# Patient Record
Sex: Female | Born: 1975 | Race: Black or African American | Hispanic: No | Marital: Single | Smoking: Current some day smoker
Health system: Southern US, Community
[De-identification: ages and names within clinical notes are randomized; demographics above are authoritative.]

## PROBLEM LIST (undated history)

## (undated) DIAGNOSIS — IMO0001 Reserved for inherently not codable concepts without codable children: Secondary | ICD-10-CM

## (undated) HISTORY — PX: NO PAST SURGERIES: SHX2092

---

## 2015-01-10 ENCOUNTER — Encounter: Payer: Self-pay | Admitting: Medical Oncology

## 2015-01-10 ENCOUNTER — Emergency Department
Admission: EM | Admit: 2015-01-10 | Discharge: 2015-01-10 | Disposition: A | Discharge status: Home / Self Care | Payer: Medicaid Other | Attending: Emergency Medicine | Admitting: Emergency Medicine

## 2015-01-10 ENCOUNTER — Emergency Department: Payer: Medicaid Other

## 2015-01-10 DIAGNOSIS — O99331 Smoking (tobacco) complicating pregnancy, first trimester: Secondary | ICD-10-CM | POA: Insufficient documentation

## 2015-01-10 DIAGNOSIS — O3680X0 Pregnancy with inconclusive fetal viability, not applicable or unspecified: Secondary | ICD-10-CM

## 2015-01-10 DIAGNOSIS — O209 Hemorrhage in early pregnancy, unspecified: Secondary | ICD-10-CM | POA: Diagnosis present

## 2015-01-10 DIAGNOSIS — F17210 Nicotine dependence, cigarettes, uncomplicated: Secondary | ICD-10-CM | POA: Diagnosis not present

## 2015-01-10 DIAGNOSIS — Z3A01 Less than 8 weeks gestation of pregnancy: Secondary | ICD-10-CM | POA: Diagnosis not present

## 2015-01-10 DIAGNOSIS — O200 Threatened abortion: Secondary | ICD-10-CM

## 2015-01-10 HISTORY — DX: Reserved for inherently not codable concepts without codable children: IMO0001

## 2015-01-10 LAB — CBC WITH DIFFERENTIAL/PLATELET
BASOS ABS: 0.1 10*3/uL (ref 0–0.1)
BASOS PCT: 1 %
EOS ABS: 0.3 10*3/uL (ref 0–0.7)
Eosinophils Relative: 4 %
HEMATOCRIT: 41.7 % (ref 35.0–47.0)
Hemoglobin: 13.2 g/dL (ref 12.0–16.0)
Lymphocytes Relative: 39 %
Lymphs Abs: 3.4 10*3/uL (ref 1.0–3.6)
MCH: 22.6 pg — ABNORMAL LOW (ref 26.0–34.0)
MCHC: 31.7 g/dL — AB (ref 32.0–36.0)
MCV: 71.4 fL — ABNORMAL LOW (ref 80.0–100.0)
MONO ABS: 0.7 10*3/uL (ref 0.2–0.9)
Monocytes Relative: 8 %
NEUTROS ABS: 4.1 10*3/uL (ref 1.4–6.5)
Neutrophils Relative %: 48 %
PLATELETS: 306 10*3/uL (ref 150–440)
RBC: 5.84 MIL/uL — ABNORMAL HIGH (ref 3.80–5.20)
RDW: 14.3 % (ref 11.5–14.5)
WBC: 8.7 10*3/uL (ref 3.6–11.0)

## 2015-01-10 LAB — COMPREHENSIVE METABOLIC PANEL
ALBUMIN: 4 g/dL (ref 3.5–5.0)
ALT: 12 U/L — ABNORMAL LOW (ref 14–54)
ANION GAP: 7 (ref 5–15)
AST: 17 U/L (ref 15–41)
Alkaline Phosphatase: 58 U/L (ref 38–126)
BUN: 13 mg/dL (ref 6–20)
CHLORIDE: 107 mmol/L (ref 101–111)
CO2: 27 mmol/L (ref 22–32)
Calcium: 9 mg/dL (ref 8.9–10.3)
Creatinine, Ser: 0.85 mg/dL (ref 0.44–1.00)
GFR calc Af Amer: >60 mL/min (ref >60)
GFR calc non Af Amer: >60 mL/min (ref >60)
GLUCOSE: 89 mg/dL (ref 65–99)
POTASSIUM: 3.6 mmol/L (ref 3.5–5.1)
SODIUM: 141 mmol/L (ref 135–145)
Total Bilirubin: 0.7 mg/dL (ref 0.3–1.2)
Total Protein: 7.3 g/dL (ref 6.5–8.1)

## 2015-01-10 LAB — HCG, QUANTITATIVE, PREGNANCY: HCG, BETA CHAIN, QUANT, S: 826 m[IU]/mL — AB (ref <5)

## 2015-01-10 LAB — ABO/RH: ABO/RH(D): O POS

## 2015-01-10 NOTE — ED Provider Notes (Signed)
[facility] Emergency Department Provider Note  ____________________________________________  Time seen: Approximately 5:42 PM  I have reviewed the triage vital signs and the nursing notes.   HISTORY  Chief Complaint Vaginal Bleeding    HPI Tracie Gamble is a 39 y.o. female G6 P4 Ab1 at possibly 4-[redacted] weeks gestation based on LMP who presents with intermittent light vaginal bleeding for 2-3 days.  She reports that she just found out she was pregnant by urine pregnancy test.  She started having the intermittent bleeding notable mostly when she wipes about 3 days ago.She has been wearing a pad but is only had to change it once a day.  She went to her primary care doctor today who repeated the positive pregnancy test and this suggested she come to the emergency department for evaluation of bleeding during early pregnancy.  She reports having moderate lower abdominal cramping when the bleeding first began several days ago but no longer has any cramping.  She has no nausea, vomiting, chest pain, shortness of breath, fever/chills, dysuria.   Past Medical History  Diagnosis Date  . Healthy adult     There are no active problems to display for this patient.   Past Surgical History  Procedure Laterality Date  . No past surgeries      Current Outpatient Rx  Name  Route  Sig  Dispense  Refill  . ibuprofen (ADVIL,MOTRIN) 600 MG tablet   Oral   Take 600 mg by mouth every 6 (six) hours as needed for mild pain.           Allergies Review of patient's allergies indicates no known allergies.  No family history on file.  Social History Social History  Substance Use Topics  . Smoking status: Current Some Day Smoker -- 0.50 packs/day for 15 years    Types: Cigarettes  . Smokeless tobacco: None  . Alcohol Use: No    Review of Systems Constitutional: No fever/chills Eyes: No visual changes. ENT: No sore throat. Cardiovascular: Denies chest  pain. Respiratory: Denies shortness of breath. Gastrointestinal: Lower abdominal cramping several days ago.  No nausea, no vomiting.  No diarrhea.  No constipation. Genitourinary: Negative for dysuria.  Light vaginal bleeding Musculoskeletal: Negative for back pain. Skin: Negative for rash. Neurological: Negative for headaches, focal weakness or numbness.  10-point ROS otherwise negative.  ____________________________________________   PHYSICAL EXAM:  VITAL SIGNS: ED Triage Vitals  Enc Vitals Group     BP 01/10/15 1657 161/106 mmHg     Pulse Rate 01/10/15 1657 74     Resp 01/10/15 1657 18     Temp 01/10/15 1657 98.4 F (36.9 C)     Temp Source 01/10/15 1657 Oral     SpO2 01/10/15 1657 100 %     Weight 01/10/15 1657 194 lb (87.998 kg)     Height 01/10/15 1657  (1.676 m)     Head Cir --      Peak Flow --      Pain Score 01/10/15 1657 6     Pain Loc --      Pain Edu? --      Excl. in GC? --     Constitutional: Alert and oriented. Well appearing and in no acute distress. Eyes: Conjunctivae are normal. PERRL. EOMI. Head: Atraumatic. Nose: No congestion/rhinnorhea. Mouth/Throat: Mucous membranes are moist.  Oropharynx non-erythematous. Neck: No stridor.   Cardiovascular: Normal rate, regular rhythm. Grossly normal heart sounds.  Good peripheral circulation. Respiratory: Normal respiratory effort.  No retractions. Lungs CTAB. Gastrointestinal: Soft and nontender. No distention. No abdominal bruits. No CVA tenderness. Genitourinary: Deferred Musculoskeletal: No lower extremity tenderness nor edema.  No joint effusions. Neurologic:  Normal speech and language. No gross focal neurologic deficits are appreciated.  Skin:  Skin is warm, dry and intact. No rash noted. Psychiatric: Mood and affect are normal. Speech and behavior are normal.  ____________________________________________   LABS (all labs ordered are listed, but only abnormal results are displayed)  Labs  Reviewed  HCG, QUANTITATIVE, PREGNANCY - Abnormal; Notable for the following:    hCG, Beta Chain, Quant, S 826 (*)    All other components within normal limits  CBC WITH DIFFERENTIAL/PLATELET - Abnormal; Notable for the following:    RBC 5.84 (*)    MCV 71.4 (*)    MCH 22.6 (*)    MCHC 31.7 (*)    All other components within normal limits  COMPREHENSIVE METABOLIC PANEL - Abnormal; Notable for the following:    ALT 12 (*)    All other components within normal limits  ABO/RH   ____________________________________________  EKG  Not indicated ____________________________________________  RADIOLOGY  Aura Camps, Harlie Buening, personally discussed these images and results by phone with the on-call radiologist and used this discussion as part of my medical decision making.    US Ob Comp Less 14 Wks  01/10/2015   CLINICAL DATA:  Gravida 6 para 4 with LMP 12/07/2014. EDC by LMP is 09/13/2015. In patient is 4 weeks 6 days by LMP. Quantitative beta HCG is 826. Vaginal bleeding.  EXAM: OBSTETRIC <14 WK [place] AND TRANSVAGINAL OB US  TECHNIQUE: Both transabdominal and transvaginal ultrasound examinations were performed for complete evaluation of the gestation as well as the maternal uterus, adnexal regions, and pelvic cul-de-sac. Transvaginal technique was performed to assess early pregnancy.  COMPARISON:  None.  FINDINGS: Intrauterine gestational sac: Not seen  Yolk sac:  Not seen  Embryo:  Not seen  Cardiac Activity: Not seen  Maternal uterus/adnexae: Adjacent to the right ovary there is a small discrete solid structure measuring 3.5 x 1.8 x 3.4 cm. This appears separate from the ovary and contains internal blood flow on Doppler evaluation. Findings are suspicious for ectopic pregnancy. The right ovary otherwise has a normal appearance with small corpus luteum cyst. The left ovary has a normal appearance. There is a small amount of free pelvic fluid.  Multiple fibroids are present. Posterior lower uterine segment  fibroid is 2.8 x 2.5 x 2.6 cm. Right uterine fundal fibroid is 3.4 x 3.4 x 3.3 cm.  IMPRESSION: 1. Right adnexal mass suspicious for possible ectopic pregnancy. 2. No intrauterine pregnancy identified. 3. Right corpus luteum cyst. 4. Small amount of free pelvic fluid. 5. Uterine fibroids present.  Critical Value/emergent results were called by telephone at the time of interpretation on 01/10/2015 at 7:42 pm to Dr. Loleta Rose , who verbally acknowledged these results.   Electronically Signed   By: Norva Pavlov M.D.   On: 01/10/2015 19:47   US Ob Transvaginal  01/10/2015   CLINICAL DATA:  Gravida 6 para 4 with LMP 12/07/2014. EDC by LMP is 09/13/2015. In patient is 4 weeks 6 days by LMP. Quantitative beta HCG is 826. Vaginal bleeding.  EXAM: OBSTETRIC <14 WK [place] AND TRANSVAGINAL OB US  TECHNIQUE: Both transabdominal and transvaginal ultrasound examinations were performed for complete evaluation of the gestation as well as the maternal uterus, adnexal regions, and pelvic cul-de-sac. Transvaginal technique was performed to assess early pregnancy.  COMPARISON:  None.  FINDINGS: Intrauterine gestational sac: Not seen  Yolk sac:  Not seen  Embryo:  Not seen  Cardiac Activity: Not seen  Maternal uterus/adnexae: Adjacent to the right ovary there is a small discrete solid structure measuring 3.5 x 1.8 x 3.4 cm. This appears separate from the ovary and contains internal blood flow on Doppler evaluation. Findings are suspicious for ectopic pregnancy. The right ovary otherwise has a normal appearance with small corpus luteum cyst. The left ovary has a normal appearance. There is a small amount of free pelvic fluid.  Multiple fibroids are present. Posterior lower uterine segment fibroid is 2.8 x 2.5 x 2.6 cm. Right uterine fundal fibroid is 3.4 x 3.4 x 3.3 cm.  IMPRESSION: 1. Right adnexal mass suspicious for possible ectopic pregnancy. 2. No intrauterine pregnancy identified. 3. Right corpus luteum cyst. 4. Small amount of  free pelvic fluid. 5. Uterine fibroids present.  Critical Value/emergent results were called by telephone at the time of interpretation on 01/10/2015 at 7:42 pm to Dr. Loleta Rose , who verbally acknowledged these results.   Electronically Signed   By: Norva Pavlov M.D.   On: 01/10/2015 19:47    ____________________________________________   PROCEDURES  Procedure(s) performed: None  Critical Care performed: No ____________________________________________   INITIAL IMPRESSION / ASSESSMENT AND PLAN / ED COURSE  Pertinent labs & imaging results that were available during my care of the patient were reviewed by me and considered in my medical decision making (see chart for details).  Based on the abnormal ultrasound, I consult to Dr. Jean Rosenthal with OB/GYN who saw the patient personally in the emergency department.  He feel she is safe to go home and return in 2 days for repeat hCG.  He is giving her an order for an outpatient hCG lab test.  He and I both gave her the usual and customary return precautions.  The patient's blood type is O+ so she does not need RhoGAM. ____________________________________________  FINAL CLINICAL IMPRESSION(S) / ED DIAGNOSES  Final diagnoses:  Threatened abortion in early pregnancy  Pregnancy of unknown anatomic location      NEW MEDICATIONS STARTED DURING THIS VISIT:  Discharge Medication List as of 01/10/2015  9:52 PM       Loleta Rose, MD 01/10/15 2225

## 2015-01-10 NOTE — Discharge Instructions (Signed)
As we discussed, we are not certain at this point if you have a pregnancy and an abnormal location, such as on your ovary (ectopic pregnancy).  Sometimes this condition can be managed with medication and other times you require surgery.  As Dr. Jean Rosenthal recommended, please return on Sunday for outpatient lab testing to repeat your hCG level.  Please call the number provided by Dr. Jean Rosenthal [phone]) about two hours after the testing to talk with Dr. Tiburcio Pea.  If you develop any new or worsening symptoms in the meantime, please return immediately to the emergency department..   Threatened Miscarriage A threatened miscarriage occurs when you have vaginal bleeding during your first 20 weeks of pregnancy but the pregnancy has not ended. If you have vaginal bleeding during this time, your health care provider will do tests to make sure you are still pregnant. If the tests show you are still pregnant and the developing baby (fetus) inside your womb (uterus) is still growing, your condition is considered a threatened miscarriage. A threatened miscarriage does not mean your pregnancy will end, but it does increase the risk of losing your pregnancy (complete miscarriage). CAUSES  The cause of a threatened miscarriage is usually not known. If you go on to have a complete miscarriage, the most common cause is an abnormal number of chromosomes in the developing baby. Chromosomes are the structures inside cells that hold all your genetic material. Some causes of vaginal bleeding that do not result in miscarriage include:  Having sex.  Having an infection.  Normal hormone changes of pregnancy.  Bleeding that occurs when an egg implants in your uterus. RISK FACTORS Risk factors for bleeding in early pregnancy include:  Obesity.  Smoking.  Drinking excessive amounts of alcohol or caffeine.  Recreational drug use. SIGNS AND SYMPTOMS  Light vaginal bleeding.  Mild abdominal pain or  cramps. DIAGNOSIS  If you have bleeding with or without abdominal pain before 20 weeks of pregnancy, your health care provider will do tests to check whether you are still pregnant. One important test involves using sound waves and a computer (ultrasound) to create images of the inside of your uterus. Other tests include an internal exam of your vagina and uterus (pelvic exam) and measurement of your baby's heart rate.  You may be diagnosed with a threatened miscarriage if:  Ultrasound testing shows you are still pregnant.  Your baby's heart rate is strong.  A pelvic exam shows that the opening between your uterus and your vagina (cervix) is closed.  Your heart rate and blood pressure are stable.  Blood tests confirm you are still pregnant. TREATMENT  No treatments have been shown to prevent a threatened miscarriage from going on to a complete miscarriage. However, the right home care is important.  HOME CARE INSTRUCTIONS   Make sure you keep all your appointments for prenatal care. This is very important.  Get plenty of rest.  Do not have sex or use tampons if you have vaginal bleeding.  Do not douche.  Do not smoke or use recreational drugs.  Do not drink alcohol.  Avoid caffeine. SEEK MEDICAL CARE IF:  You have light vaginal bleeding or spotting while pregnant.  You have abdominal pain or cramping.  You have a fever. SEEK IMMEDIATE MEDICAL CARE IF:  You have heavy vaginal bleeding.  You have blood clots coming from your vagina.  You have severe low back pain or abdominal cramps.  You have fever, chills, and severe abdominal pain. MAKE SURE YOU:  Understand these instructions.  Will watch your condition.  Will get help right away if you are not doing well or get worse. Document Released: 04/26/2005 Document Revised: 05/01/2013 Document Reviewed: 02/20/2013 [facility] Patient Information 2015 [place], [place]. This information is not intended to replace advice  given to you by your health care provider. Make sure you discuss any questions you have with your health care provider.

## 2015-01-10 NOTE — ED Notes (Signed)
Patient transported to Ultrasound 

## 2015-01-10 NOTE — ED Notes (Signed)
Pt reports she had recent positive preg test, went to pcp today for vaginal bleeding and PCP told pt to f/u at the ER for ultrasound. Pt denies pain at this time.

## 2015-01-10 NOTE — ED Notes (Signed)
Lab call regarding ABO/RH add on, states has in lab currently and will add on at this time

## 2015-01-10 NOTE — ED Notes (Signed)
Pt reports that she is approximately [redacted] weeks pregnant based on the date of LMP. Pt reports that she began to bleed on Wednesday along with some cramping. She reports that she is no longer cramping, and that the bleeding continues. She states that the bleeding is lighter than a normal period, and that she notices it when she urinates and there is some blood on the toilet paper when she wipes. Pt alert & oriented with warm, dry skin. NAD noted.

## 2015-01-10 NOTE — Consult Note (Signed)
GYNECOLOGY CONSULT NOTE  GYN Consultation  Attending Provider: Loleta Rose, MD  Tracie Gamble [id number] 01/10/2015 9:40 PM    Reason for Consultation:   Tracie Gamble is a 39 y.o. [id number] female seen at the request of Dr. Loleta Rose for evaluation of vaginal bleeding in the setting of pregnancy associated with abdominal pain with an overall concern for ectopic pregnancy.    History of Present Ilness:   She presents with new-onset light vaginal bleeding that started two days ago and has gradually become lighter, to the point that it has gone away.  She also had some lower abdominal discomfort that has not also resolved.  She saw her primary care provider today who recommended that she come to the hospital for an ultrasound.  She denies a history of PID, prior tubal or pelvic surgery, and prior ectopic pregnancy.  She denies fevers, chills.  She is taking nothing for contraception at this time.  Past Medical History  Diagnosis Date  . Healthy adult    Past Surgical History  Procedure Laterality Date  . No past surgeries     Allergies:No Known Allergies   Prior to Admission medications   Medication Sig Start Date End Date Taking? Authorizing Provider  ibuprofen (ADVIL,MOTRIN) 600 MG tablet Take 600 mg by mouth every 6 (six) hours as needed for mild pain.   Yes Historical Provider, MD   Obstetric History: She is a [id number] female s/p SVD x 4.    Gynecologic History:   Patient's last menstrual period was 12/07/2014 (exact date). Cycle Hx: flow is moderate Last Pap: normal about two years ago. She notes a distant history of an STD in 1997 which was treated. Contraception: none  Social History:  She  reports that she has been smoking Cigarettes.  She has a 7.5 pack-year smoking history. She does not have any smokeless tobacco history on file. She reports that she does not drink alcohol or use illicit drugs.  Family History:  family history is not on file.   Review of Systems:   Pertinent positives include headache.  Otherwise negative x 10 systems reviewed except as noted in the HPI.    Objective    BP 149/87 mmHg  Pulse 58  Temp(Src) 98.4 F (36.9 C) (Oral)  Resp 16  Ht  (1.676 m)  Wt 194 lb (87.998 kg)  BMI 31.33 kg/m2  SpO2 99%  LMP 12/07/2014 (Exact Date) Physical Exam  General:  She is a well appearing female in no distress.  HEENT:  Normocephalic, atraumatic.   Neck:  supple, no lymphadenopathy Cardiac:  RRR Pulmonary:  Clear to auscultation bilaterally. No wheezes, rales, rhonchi.   Abdomen:  Soft, nontender, non-distended, +BS, no rebound or guarding  Pelvic:  deferred Extremities:  Non-tender, symmetric no edema bilaterally.   Neurologic:  Alert & oriented x 3.  Appropriate, conversant.    Laboratory Results:   Lab Results  Component Value Date   WBC 8.7 01/10/2015   RBC 5.84* 01/10/2015   HGB 13.2 01/10/2015   HCT 41.7 01/10/2015   PLT 306 01/10/2015   NA 141 01/10/2015   K 3.6 01/10/2015   CREATININE 0.85 01/10/2015   Quant hCT: 826 Blood type: O+  Imaging Results: Pelvic U/S Report performed 01/10/15 FINDINGS: Intrauterine gestational sac: Not seen  Yolk sac: Not seen  Embryo: Not seen  Cardiac Activity: Not seen  Maternal uterus/adnexae: Adjacent to the right ovary there is a small discrete solid structure measuring 3.5 x 1.8 x 3.4  cm. This appears separate from the ovary and contains internal blood flow on Doppler evaluation. Findings are suspicious for ectopic pregnancy. The right ovary otherwise has a normal appearance with small corpus luteum cyst. The left ovary has a normal appearance. There is a small amount of free pelvic fluid.  Multiple fibroids are present. Posterior lower uterine segment fibroid is 2.8 x 2.5 x 2.6 cm. Right uterine fundal fibroid is 3.4 x 3.4 x 3.3 cm.  IMPRESSION: 1. Right adnexal mass suspicious for possible ectopic pregnancy. 2. No intrauterine pregnancy identified. 3.  Right corpus luteum cyst. 4. Small amount of free pelvic fluid. 5. Uterine fibroids present.   Assessment & Recommendations  Tracie Gamble is a 39 y.o. [id number] premenopausal female with vaginal bleeding and abdominal pain (though largely resolved now) being seen in consultation.  Differential diagnosis includes ectopic pregnancy, normal pregnancy with threatened abortion and incidental finding of an adnexal mass. Discussed that I am highly concerned that she has an ectopic pregnancy.  We discussed that I also could not rule out a normal pregnancy given her LMP and that she has had only light bleeding and mild cramping. Discussed risks of ectopic pregnancy and potential dangers of not following up on this condition, including; excessive bleeding and ultimately risk of death.  The recommendations below reflect the mutually agreed upon plan by both of [place].  Recommendations:  1.  Present to [facility] in two days for re-draw of quant hCG at outpatient draw station. She was given an order for this draw on a prescription pad piece of paper with very specific instructions on how to come to the hospital and register and then go to the lab. 2.  Call the on-call pager two hours after having her lab work drawn.  She was given the number to [facility] in written format. She was instructed to follow the prompts to the on-call pager. Dr Tiburcio Pea will review her results and form a follow up plan of action for her at this point.   3. She is to return to the ER should she have any return of abdominal or pelvic pain and/or vaginal bleeding.  She voiced understanding and agreement with the plan.  Conard Novak, MD 01/10/2015 9:40 PM

## 2015-01-10 NOTE — ED Notes (Signed)
OB MD at bedside

## 2015-01-12 ENCOUNTER — Other Ambulatory Visit
Admission: AD | Admit: 2015-01-12 | Discharge: 2015-01-12 | Disposition: A | Discharge status: Home / Self Care | Payer: Medicaid Other | Source: Other Acute Inpatient Hospital | Attending: Obstetrics and Gynecology | Admitting: Obstetrics and Gynecology

## 2015-01-12 DIAGNOSIS — Z3490 Encounter for supervision of normal pregnancy, unspecified, unspecified trimester: Secondary | ICD-10-CM | POA: Diagnosis not present

## 2015-01-12 LAB — HCG, QUANTITATIVE, PREGNANCY: HCG, BETA CHAIN, QUANT, S: 1237 m[IU]/mL — AB (ref <5)

## 2015-11-13 ENCOUNTER — Other Ambulatory Visit: Payer: Self-pay | Admitting: Obstetrics and Gynecology

## 2015-11-13 DIAGNOSIS — O200 Threatened abortion: Secondary | ICD-10-CM

## 2015-11-14 ENCOUNTER — Ambulatory Visit
Admission: RE | Admit: 2015-11-14 | Discharge: 2015-11-14 | Disposition: A | Discharge status: Home / Self Care | Payer: Medicaid Other | Source: Ambulatory Visit | Attending: Obstetrics and Gynecology | Admitting: Obstetrics and Gynecology

## 2015-11-14 DIAGNOSIS — O3411 Maternal care for benign tumor of corpus uteri, first trimester: Secondary | ICD-10-CM | POA: Diagnosis not present

## 2015-11-14 DIAGNOSIS — O200 Threatened abortion: Secondary | ICD-10-CM | POA: Insufficient documentation

## 2015-11-14 DIAGNOSIS — Z3A01 Less than 8 weeks gestation of pregnancy: Secondary | ICD-10-CM | POA: Insufficient documentation

## 2015-11-14 DIAGNOSIS — O99411 Diseases of the circulatory system complicating pregnancy, first trimester: Secondary | ICD-10-CM | POA: Insufficient documentation

## 2016-07-21 IMAGING — US US OB COMP LESS 14 WK
1 series · 13 of 28 positions shown · non-contrast
Comparison: None.

CLINICAL DATA: Gravida 6 para 4 with LMP 12/07/2014. EDC by LMP is
09/13/2015. In patient is 4 weeks 6 days by LMP. Quantitative beta
HCG is 826. Vaginal bleeding.

EXAM:
OBSTETRIC <14 WK US AND TRANSVAGINAL OB US
TECHNIQUE: Both transabdominal and transvaginal ultrasound examinations were
performed for complete evaluation of the gestation as well as the
maternal uterus, adnexal regions, and pelvic cul-de-sac.
Transvaginal technique was performed to assess early pregnancy.

[Series 1: us ob comp less 14 wk · 0.18mm/px · 13 of 112 slices shown]
[im 5/112]
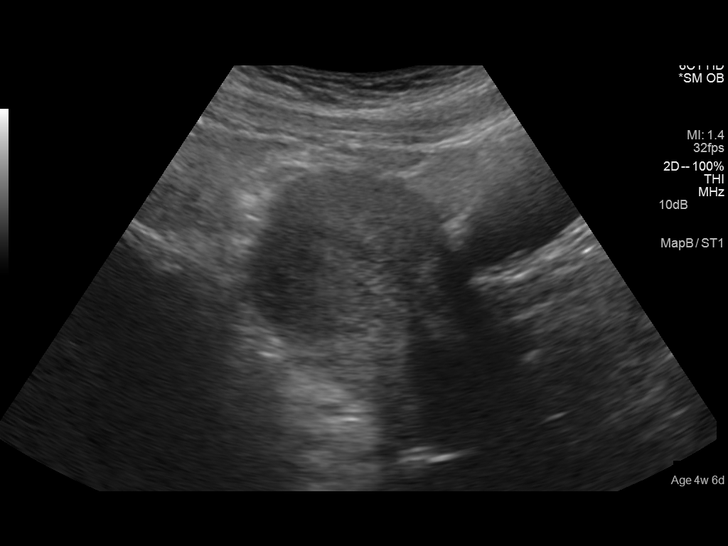
[im 13/112]
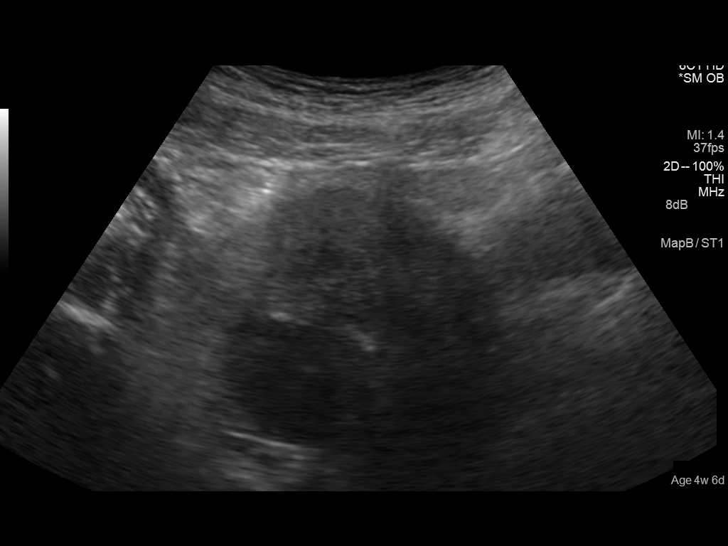
[im 21/112]
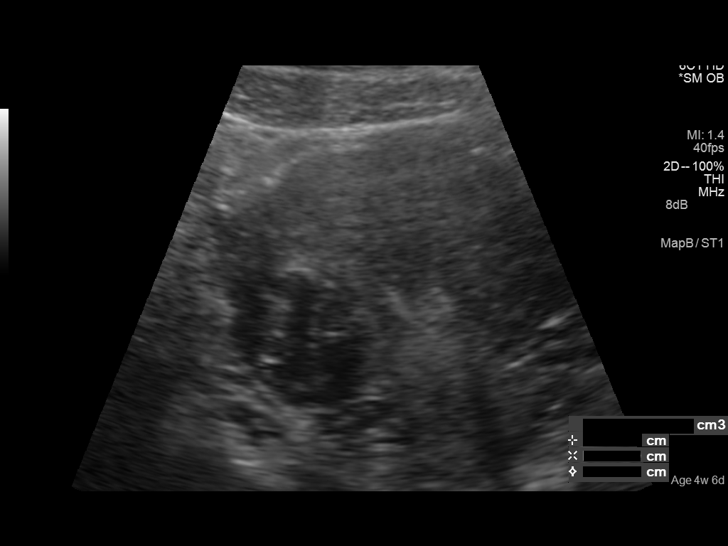
[im 29/112]
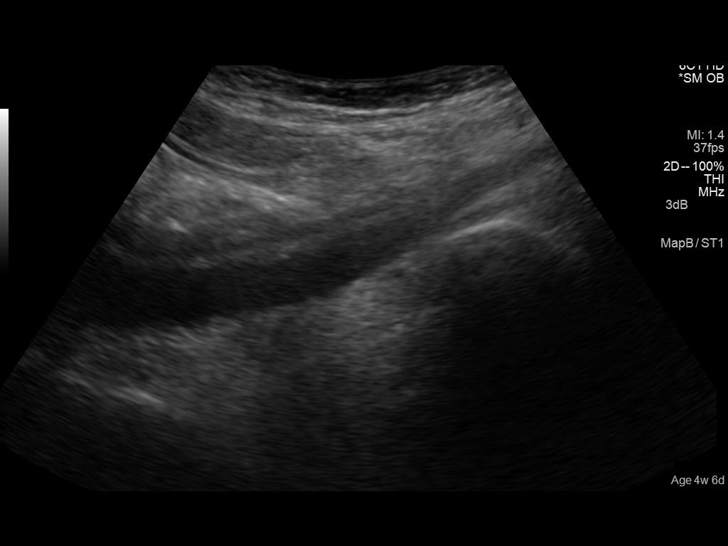
[im 38/112]
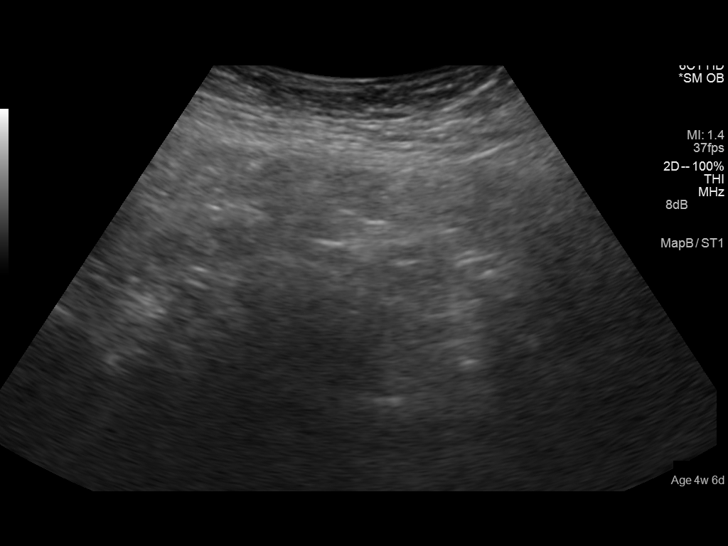
[im 46/112]
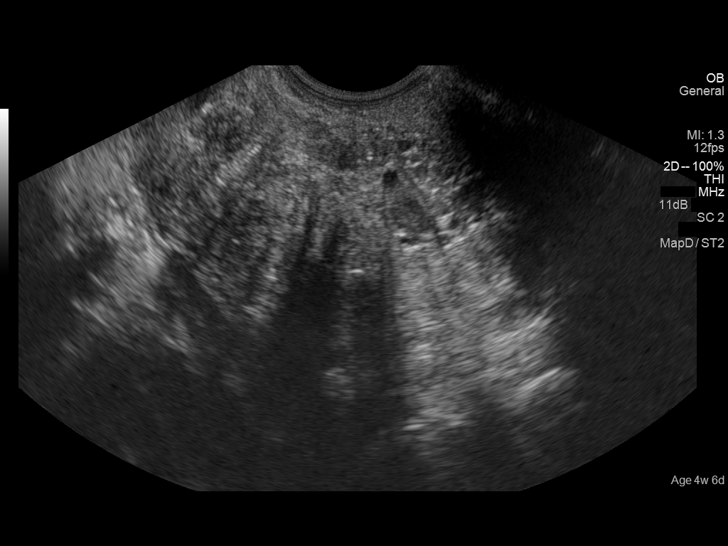
[im 58/112]
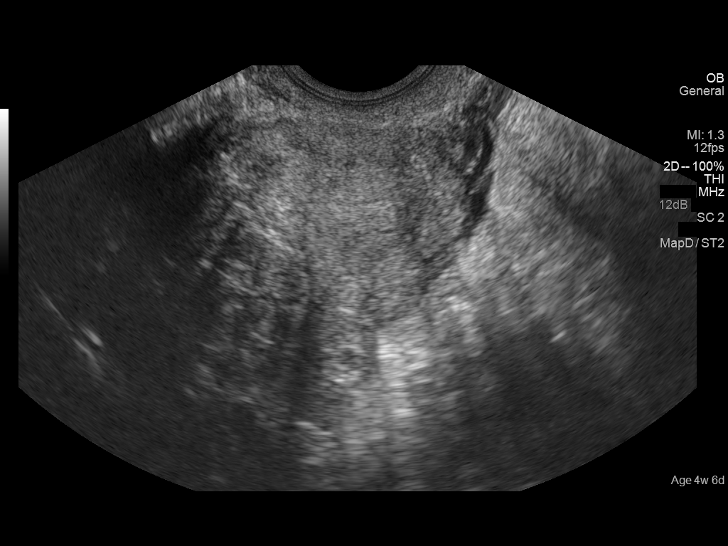
[im 66/112]
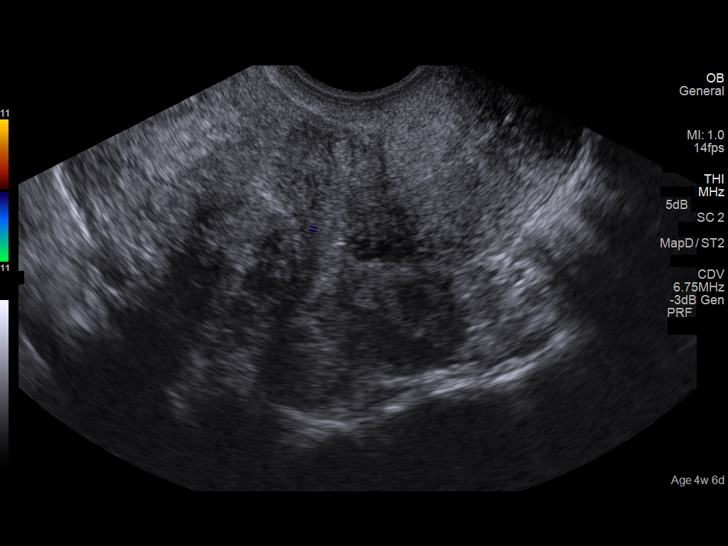
[im 75/112]
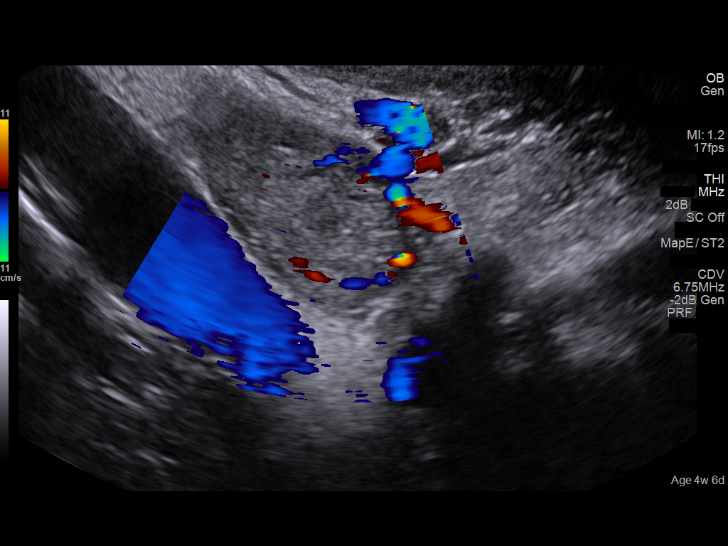
[im 83/112]
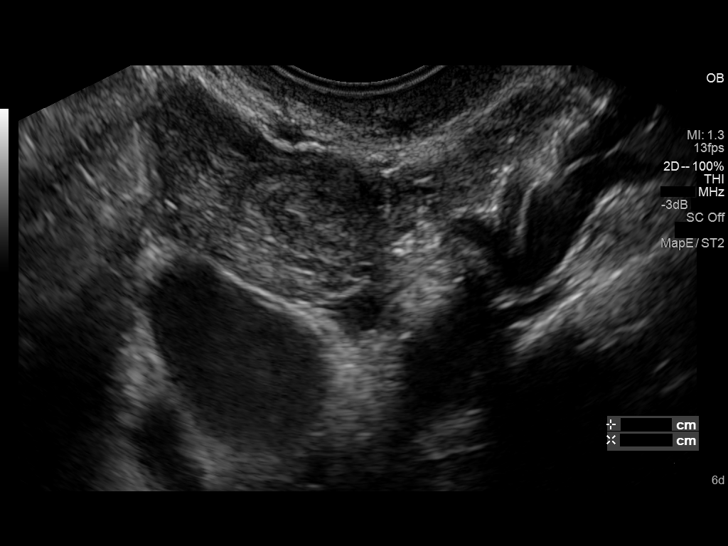
[im 91/112]
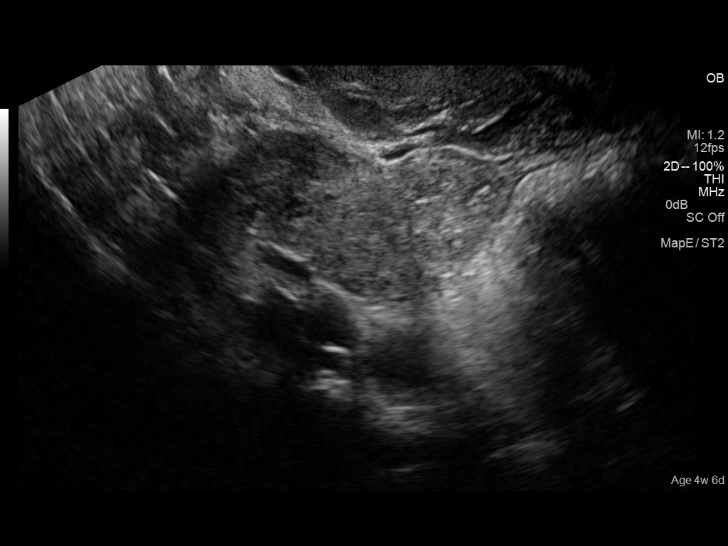
[im 99/112]
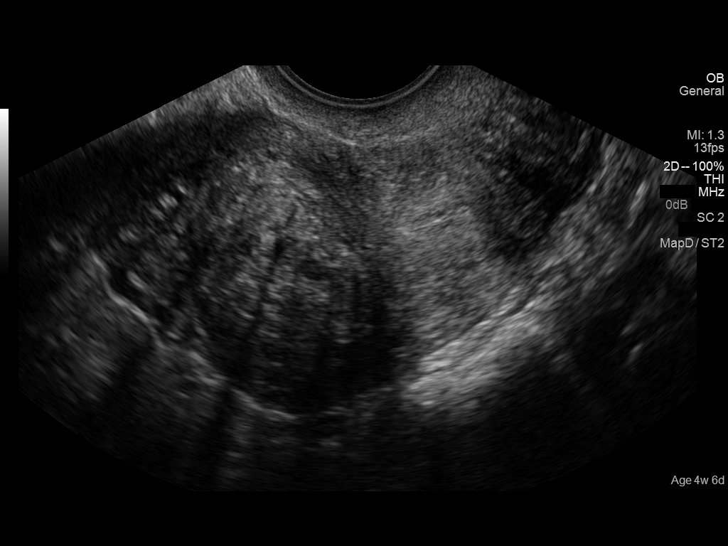
[im 107/112]
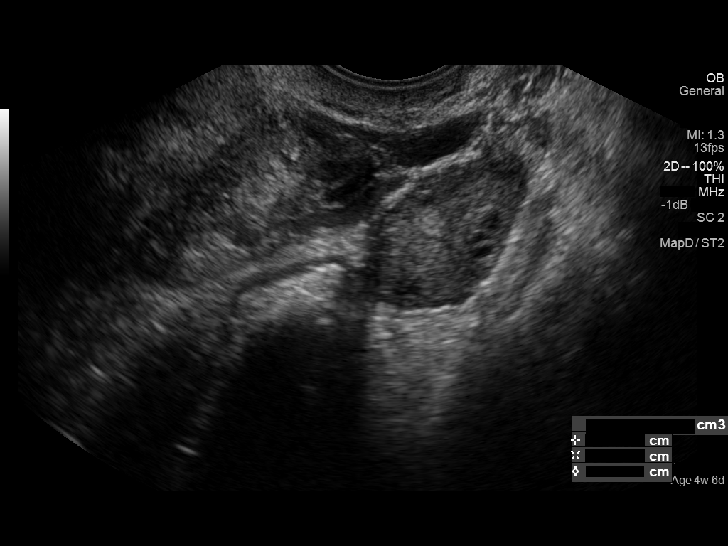

[13 of 28 positions shown; findings below may reference images not displayed]

FINDINGS: Intrauterine gestational sac: Not seen

Yolk sac:  Not seen

Embryo:  Not seen

Cardiac Activity: Not seen

Maternal uterus/adnexae: Adjacent to the right ovary there is a
small discrete solid structure measuring 3.5 x 1.8 x 3.4 cm. This
appears separate from the ovary and contains internal blood flow on
Doppler evaluation. Findings are suspicious for ectopic pregnancy.
The right ovary otherwise has a normal appearance with small corpus
luteum cyst. The left ovary has a normal appearance. There is a
small amount of free pelvic fluid.

Multiple fibroids are present. Posterior lower uterine segment
fibroid is 2.8 x 2.5 x 2.6 cm. Right uterine fundal fibroid is 3.4 x
3.4 x 3.3 cm.
IMPRESSION: 1. Right adnexal mass suspicious for possible ectopic pregnancy.
2. No intrauterine pregnancy identified.
3. Right corpus luteum cyst.
4. Small amount of free pelvic fluid.
5. Uterine fibroids present.

Critical Value/emergent results were called by telephone at the time
of interpretation on 01/10/2015 at [DATE] to Dr. FAUDNER BASAES , who
verbally acknowledged these results.

## 2016-11-12 ENCOUNTER — Encounter: Payer: Self-pay | Admitting: Emergency Medicine

## 2016-11-12 ENCOUNTER — Emergency Department
Admission: EM | Admit: 2016-11-12 | Discharge: 2016-11-12 | Disposition: A | Discharge status: Home / Self Care | Payer: No Typology Code available for payment source | Attending: Emergency Medicine | Admitting: Emergency Medicine

## 2016-11-12 DIAGNOSIS — Y998 Other external cause status: Secondary | ICD-10-CM | POA: Insufficient documentation

## 2016-11-12 DIAGNOSIS — S39012A Strain of muscle, fascia and tendon of lower back, initial encounter: Secondary | ICD-10-CM

## 2016-11-12 DIAGNOSIS — F17210 Nicotine dependence, cigarettes, uncomplicated: Secondary | ICD-10-CM | POA: Insufficient documentation

## 2016-11-12 DIAGNOSIS — Y939 Activity, unspecified: Secondary | ICD-10-CM | POA: Insufficient documentation

## 2016-11-12 DIAGNOSIS — Y92410 Unspecified street and highway as the place of occurrence of the external cause: Secondary | ICD-10-CM | POA: Insufficient documentation

## 2016-11-12 MED ORDER — NAPROXEN 500 MG PO TABS
500.0000 mg | ORAL_TABLET | Freq: Once | ORAL | Status: AC
  Administered 2016-11-12: 500 mg via ORAL
  Filled 2016-11-12: qty 1
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
## 2016-11-12 NOTE — Discharge Instructions (Signed)
Utilize OTC ibuprofen or Aleve as needed and as directed for back pain until symptoms improve.
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
## 2017-05-25 IMAGING — US US OB COMP LESS 14 WK
1 series · 13 of 28 positions shown · non-contrast
Comparison: 01/10/2015

CLINICAL DATA: Threatened abortion

EXAM:
OBSTETRIC <14 WK US AND TRANSVAGINAL OB US
TECHNIQUE: Both transabdominal and transvaginal ultrasound examinations were
performed for complete evaluation of the gestation as well as the
maternal uterus, adnexal regions, and pelvic cul-de-sac.
Transvaginal technique was performed to assess early pregnancy.

[Series 1: us ob comp less 14 wk · 0.15mm/px · 13 of 125 slices shown]
[im 5/125]
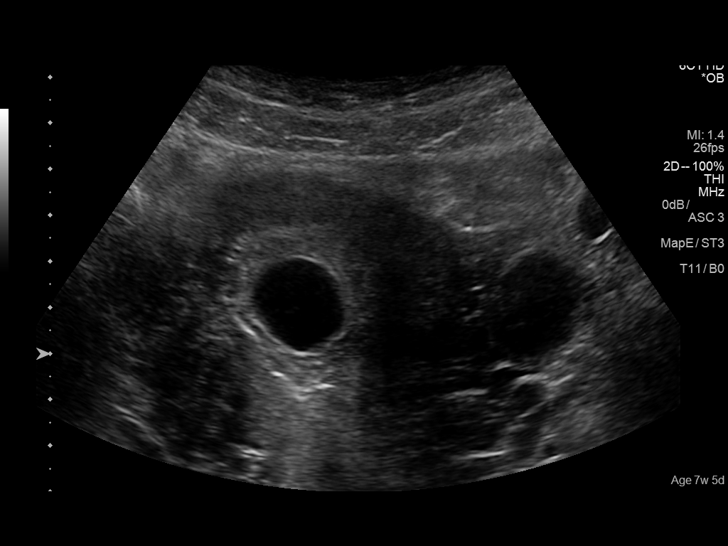
[im 14/125]
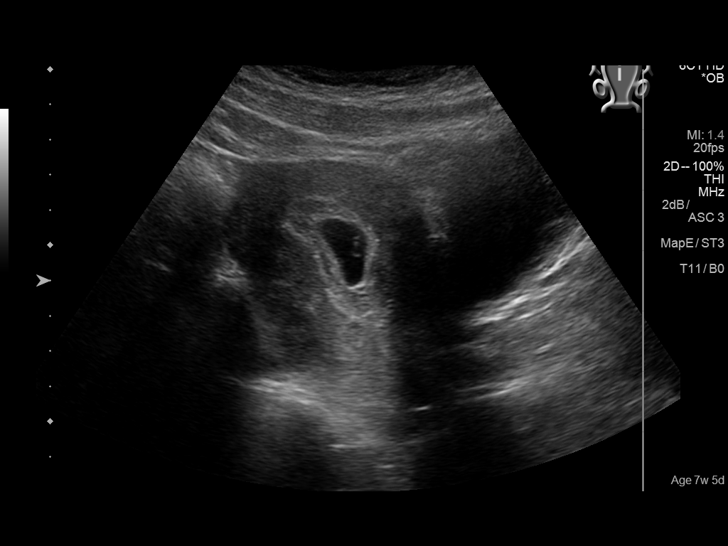
[im 23/125]
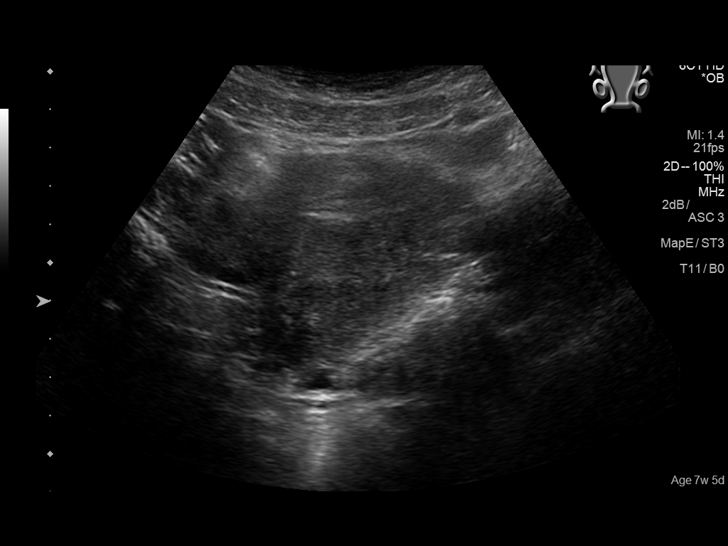
[im 33/125]
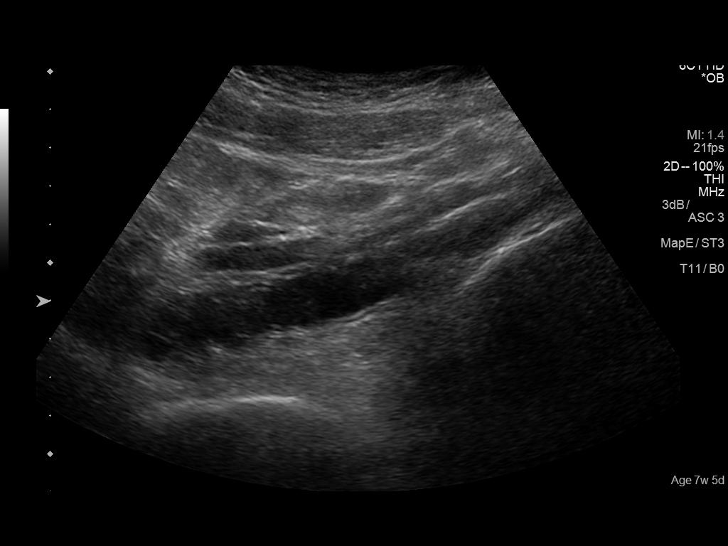
[im 42/125]
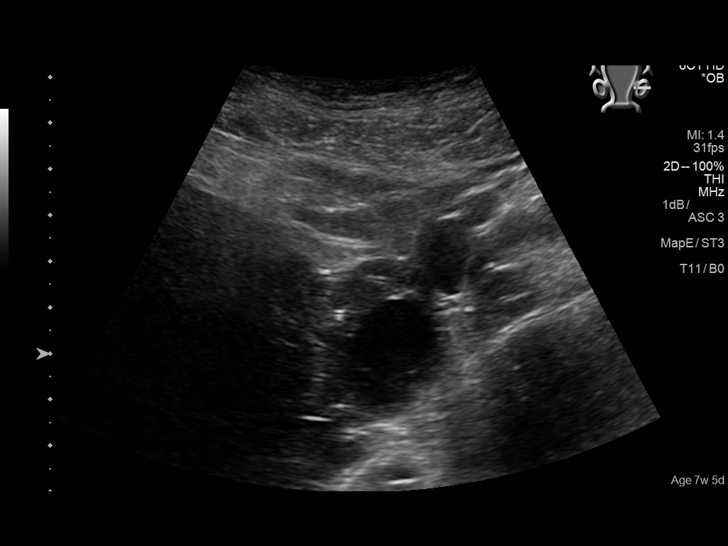
[im 51/125]
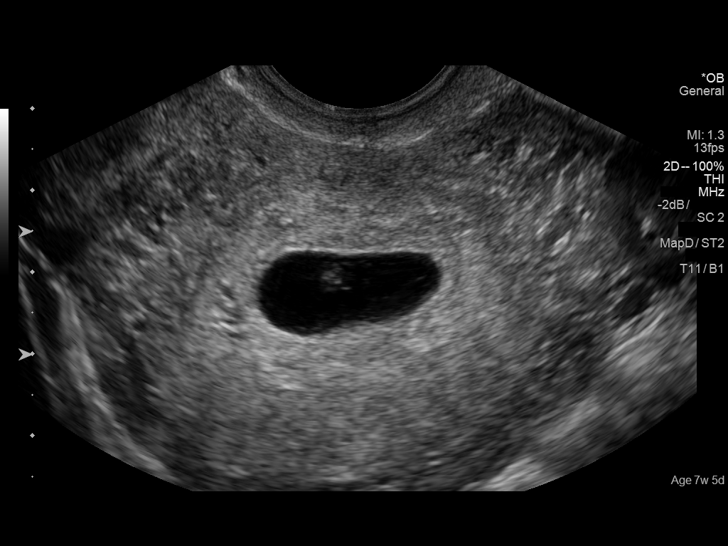
[im 65/125]
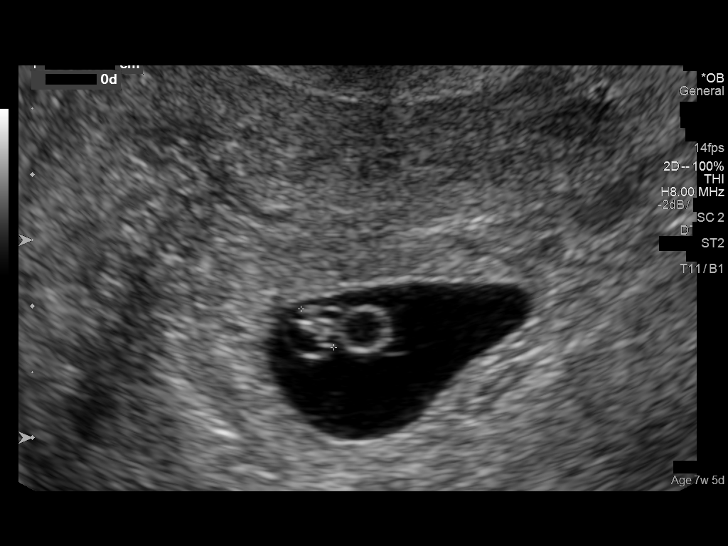
[im 74/125]
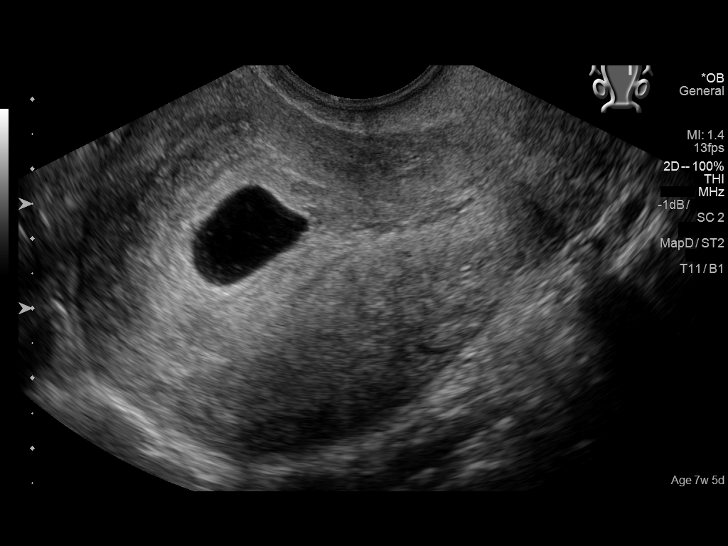
[im 83/125]
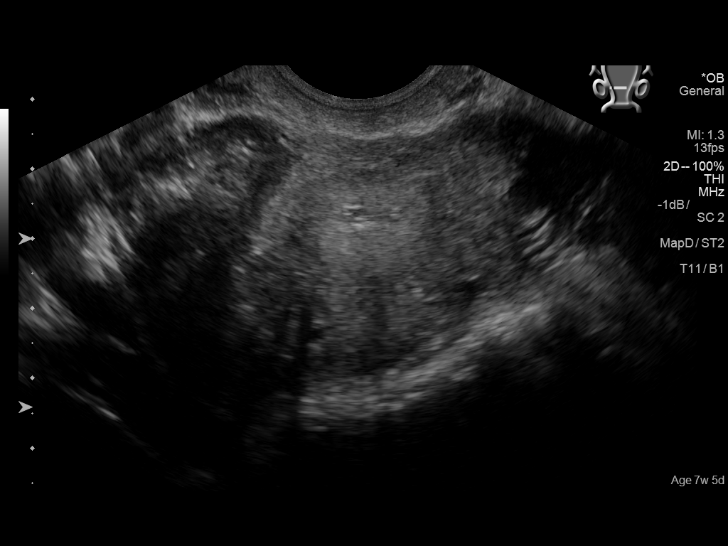
[im 92/125]
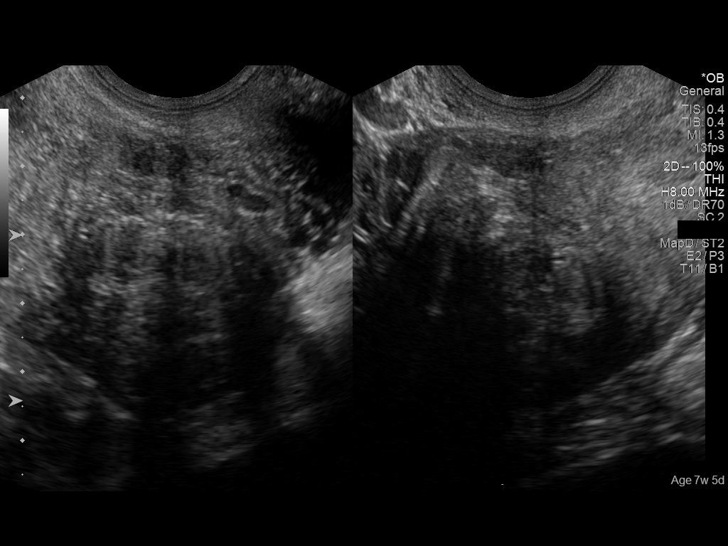
[im 102/125]
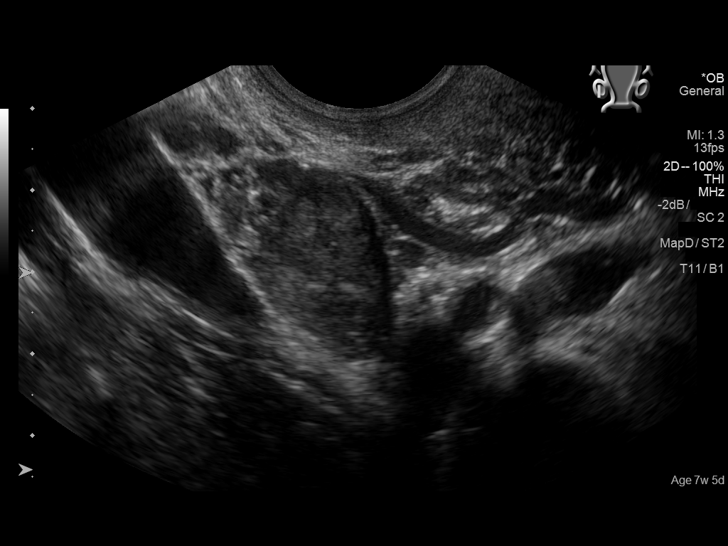
[im 111/125]
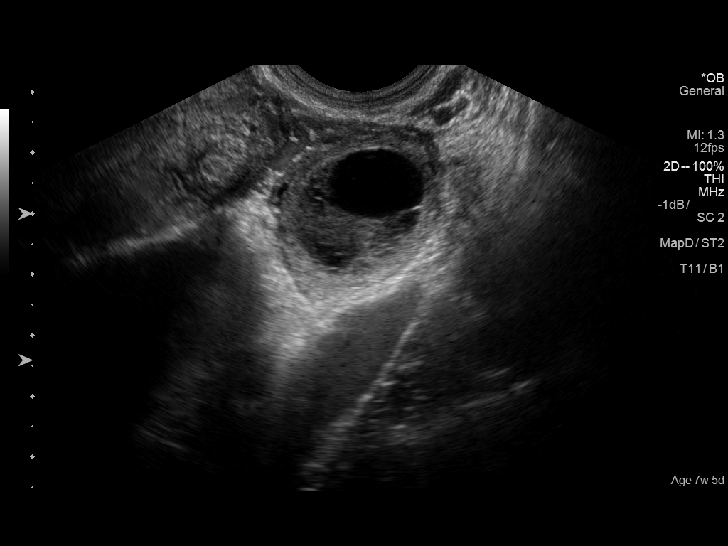
[im 120/125]
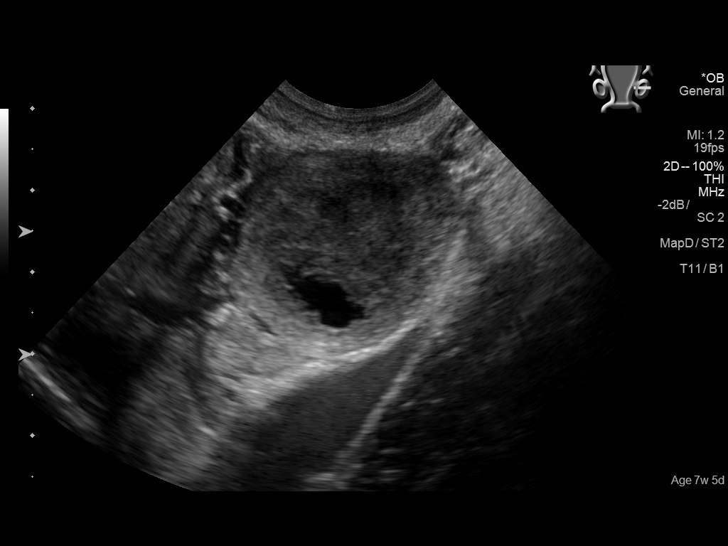

[13 of 28 positions shown; findings below may reference images not displayed]

FINDINGS: Intrauterine gestational sac: Present

Yolk sac:  Present

Embryo:  Present

Cardiac Activity: Present on cine clip with slow irregular rate.

Heart Rate: Not confidently captured on motion Doppler. Sonographer
estimates 73 beats per minute.

CRL:  3.8  mm   6 w   0 d                  US EDC: 08/04/2016

Subchorionic hemorrhage:  None visualized.

Maternal uterus/adnexae: Complex left intra-ovarian cystic mass with
convex and concave material internally that is nonvascular, up to 3
cm cyst diameter. This cyst is peripherally hypervascular.

Fibroid uterus with right-sided intramural fibroids measuring up to
37 and 16 mm individually.
IMPRESSION: 1. Single intrauterine pregnancy measuring 6 weeks +/-4 days. Slow
irregular heartbeat on cine clip; suggest follow-up for viability.
2. No subchronic hemorrhage.
3. Intramural fibroids.
4. 3 cm hemorrhagic left corpus luteum.
